# Patient Record
Sex: Male | Born: 1990 | Race: Black or African American | Hispanic: No | Marital: Single | Smoking: Never smoker
Health system: Southern US, Community
[De-identification: ages and names within clinical notes are randomized; demographics above are authoritative.]

## PROBLEM LIST (undated history)

## (undated) HISTORY — PX: NOSE SURGERY: SHX723

---
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
## 2011-05-08 ENCOUNTER — Emergency Department: Payer: Self-pay | Admitting: Unknown Physician Specialty

## 2014-05-23 ENCOUNTER — Emergency Department: Payer: Self-pay | Admitting: Emergency Medicine

## 2014-05-23 LAB — URINALYSIS, COMPLETE
BACTERIA: NONE SEEN
BILIRUBIN, UR: NEGATIVE
Blood: NEGATIVE
GLUCOSE, UR: NEGATIVE mg/dL (ref 0–75)
Leukocyte Esterase: NEGATIVE
Nitrite: NEGATIVE
Ph: 7 (ref 4.5–8.0)
Protein: 30
Specific Gravity: 1.026 (ref 1.003–1.030)

## 2014-05-23 LAB — COMPREHENSIVE METABOLIC PANEL
ALK PHOS: 59 U/L
ALT: 22 U/L
Albumin: 4.3 g/dL (ref 3.4–5.0)
Anion Gap: 11 (ref 7–16)
BUN: 10 mg/dL (ref 7–18)
Bilirubin,Total: 0.6 mg/dL (ref 0.2–1.0)
Calcium, Total: 9.1 mg/dL (ref 8.5–10.1)
Chloride: 106 mmol/L (ref 98–107)
Co2: 23 mmol/L (ref 21–32)
Creatinine: 1.28 mg/dL (ref 0.60–1.30)
EGFR (African American): >60
GLUCOSE: 119 mg/dL — AB (ref 65–99)
Osmolality: 280 (ref 275–301)
Potassium: 3.7 mmol/L (ref 3.5–5.1)
SGOT(AST): 17 U/L (ref 15–37)
Sodium: 140 mmol/L (ref 136–145)
Total Protein: 8 g/dL (ref 6.4–8.2)

## 2014-05-23 LAB — CBC
HCT: 46.7 % (ref 40.0–52.0)
HGB: 15.2 g/dL (ref 13.0–18.0)
MCH: 30.1 pg (ref 26.0–34.0)
MCHC: 32.5 g/dL (ref 32.0–36.0)
MCV: 93 fL (ref 80–100)
Platelet: 314 10*3/uL (ref 150–440)
RBC: 5.04 10*6/uL (ref 4.40–5.90)
RDW: 13.6 % (ref 11.5–14.5)
WBC: 6.7 10*3/uL (ref 3.8–10.6)

## 2018-12-30 ENCOUNTER — Other Ambulatory Visit: Payer: Self-pay

## 2018-12-30 DIAGNOSIS — Z20822 Contact with and (suspected) exposure to covid-19: Secondary | ICD-10-CM

## 2018-12-31 LAB — NOVEL CORONAVIRUS, NAA: SARS-CoV-2, NAA: DETECTED — AB

## 2019-01-08 ENCOUNTER — Other Ambulatory Visit: Payer: Self-pay

## 2019-01-08 DIAGNOSIS — Z20822 Contact with and (suspected) exposure to covid-19: Secondary | ICD-10-CM

## 2019-01-09 LAB — NOVEL CORONAVIRUS, NAA: SARS-CoV-2, NAA: NOT DETECTED

## 2021-08-03 ENCOUNTER — Ambulatory Visit (INDEPENDENT_AMBULATORY_CARE_PROVIDER_SITE_OTHER): Payer: 59 | Admitting: Nurse Practitioner

## 2021-08-03 ENCOUNTER — Other Ambulatory Visit (HOSPITAL_COMMUNITY)
Admission: RE | Admit: 2021-08-03 | Discharge: 2021-08-03 | Disposition: A | Discharge status: Home / Self Care | Payer: Self-pay | Source: Ambulatory Visit | Attending: Nurse Practitioner | Admitting: Nurse Practitioner

## 2021-08-03 ENCOUNTER — Encounter: Payer: Self-pay | Admitting: Nurse Practitioner

## 2021-08-03 ENCOUNTER — Other Ambulatory Visit: Payer: Self-pay

## 2021-08-03 VITALS — BP 118/72 | HR 81 | Temp 97.9°F | Resp 18 | Ht 67.0 in | Wt 172.4 lb

## 2021-08-03 DIAGNOSIS — Z130 Encounter for screening for diseases of the blood and blood-forming organs and certain disorders involving the immune mechanism: Secondary | ICD-10-CM | POA: Diagnosis not present

## 2021-08-03 DIAGNOSIS — Z114 Encounter for screening for human immunodeficiency virus [HIV]: Secondary | ICD-10-CM

## 2021-08-03 DIAGNOSIS — Z23 Encounter for immunization: Secondary | ICD-10-CM | POA: Diagnosis not present

## 2021-08-03 DIAGNOSIS — Z13220 Encounter for screening for lipoid disorders: Secondary | ICD-10-CM | POA: Diagnosis not present

## 2021-08-03 DIAGNOSIS — Z113 Encounter for screening for infections with a predominantly sexual mode of transmission: Secondary | ICD-10-CM | POA: Diagnosis not present

## 2021-08-03 DIAGNOSIS — Z1159 Encounter for screening for other viral diseases: Secondary | ICD-10-CM

## 2021-08-03 DIAGNOSIS — Z131 Encounter for screening for diabetes mellitus: Secondary | ICD-10-CM

## 2021-08-03 DIAGNOSIS — Z7689 Persons encountering health services in other specified circumstances: Secondary | ICD-10-CM

## 2021-08-03 NOTE — Progress Notes (Signed)
? ?  BP 118/72   Pulse 81   Temp 97.9 ?F (36.6 ?C) (Oral)   Resp 18   Ht 5\' 7"  (1.702 m)   Wt 172 lb 6.4 oz (78.2 kg)   SpO2 98%   BMI 27.00 kg/m?   ? ?Subjective:  ? ? Patient ID: Bryan Richardson, male    DOB: 1991/01/06, 31 y.o.   MRN: [id number] ? ?HPI: ?Bryan Richardson is a 31 y.o. male, here alone ? ?Chief Complaint  ?Patient presents with  ? Establish Care  ? Exposure to STD  ? ?Establish care: last physical was a few years ago.  He does not have any medical history. ? ?Exposure to STD: He says that his girlfriend tested positive for herpes. He would like to be tested. He says that he does not have any sores or blisters on his penis. He denies any penile discharge or pain.  Will get labs.  Discussed treatment options if positive.   ? ?Relevant past medical, surgical, family and social history reviewed and updated as indicated. Interim medical history since our last visit reviewed. ?Allergies and medications reviewed and updated. ? ?Review of Systems ? ?Constitutional: Negative for fever or weight change.  ?Respiratory: Negative for cough and shortness of breath.   ?Cardiovascular: Negative for chest pain or palpitations.  ?Gastrointestinal: Negative for abdominal pain, no bowel changes.  ?Musculoskeletal: Negative for gait problem or joint swelling.  ?Skin: Negative for rash.  ?Neurological: Negative for dizziness or headache.  ?No other specific complaints in a complete review of systems (except as listed in HPI above).  ? ?   ?Objective:  ?  ?BP 118/72   Pulse 81   Temp 97.9 ?F (36.6 ?C) (Oral)   Resp 18   Ht 5\' 7"  (1.702 m)   Wt 172 lb 6.4 oz (78.2 kg)   SpO2 98%   BMI 27.00 kg/m?   ?Wt Readings from Last 3 Encounters:  ?08/03/21 172 lb 6.4 oz (78.2 kg)  ?  ?Physical Exam ? ?Constitutional: Patient appears well-developed and well-nourished.  No distress.  ?HEENT: head atraumatic, normocephalic, pupils equal and reactive to light, neck supple ?Cardiovascular: Normal rate, regular rhythm and normal  heart sounds.  No murmur heard. No BLE edema. ?Pulmonary/Chest: Effort normal and breath sounds normal. No respiratory distress. ?Abdominal: Soft.  There is no tenderness. ?Psychiatric: Patient has a normal mood and affect. behavior is normal. Judgment and thought content normal.  ? ?Results for orders placed or performed in visit on 01/08/19  ?Novel Coronavirus, NAA ([facility])  ? Specimen: Oropharyngeal(OP) collection in vial transport medium  ? OROPHARYNGEA  TESTING  ?Result Value Ref Range  ? SARS-CoV-2, NAA Not Detected Not Detected  ? ?   ?Assessment & Plan:  ? ?1. Screening for STD (sexually transmitted disease) ? ?- RPR ?- HIV Antibody (routine testing w rflx) ?- HSV(herpes simplex vrs) 1+2 ab-IgG ?- Urine cytology ancillary only ? ?2. Screening for diabetes mellitus ? ?- COMPLETE METABOLIC PANEL WITH GFR ? ?3. Screening for cholesterol level ? ?- Lipid panel ? ?4. Screening for deficiency anemia ? ?- CBC with Differential/Platelet ? ?5. Screening for HIV (human immunodeficiency virus) ? ?- HIV Antibody (routine testing w rflx) ? ?6. Encounter for hepatitis C screening test for low risk patient ? ?- Hepatitis C antibody ? ?7. Encounter to establish care ?-schedule for cpe ? ?Follow up plan: ?Return in about 3 months (around 11/03/2021) for cpe. ? ? ? ? ? ?

## 2021-08-03 NOTE — Addendum Note (Signed)
Addended by: Andersen Mckiver, Sherrill Raring on: 08/03/2021 12:01 PM ? ? Modules accepted: Orders ? ?

## 2021-08-04 LAB — COMPLETE METABOLIC PANEL WITH GFR
AG Ratio: 1.6 (calc) (ref 1.0–2.5)
ALT: 16 U/L (ref 9–46)
AST: 16 U/L (ref 10–40)
Albumin: 4.4 g/dL (ref 3.6–5.1)
Alkaline phosphatase (APISO): 59 U/L (ref 36–130)
BUN: 11 mg/dL (ref 7–25)
CO2: 28 mmol/L (ref 20–32)
Calcium: 9.8 mg/dL (ref 8.6–10.3)
Chloride: 105 mmol/L (ref 98–110)
Creat: 1.05 mg/dL (ref 0.60–1.26)
Globulin: 2.7 g/dL (calc) (ref 1.9–3.7)
Glucose, Bld: 91 mg/dL (ref 65–99)
Potassium: 4.4 mmol/L (ref 3.5–5.3)
Sodium: 141 mmol/L (ref 135–146)
Total Bilirubin: 0.4 mg/dL (ref 0.2–1.2)
Total Protein: 7.1 g/dL (ref 6.1–8.1)
eGFR: 98 mL/min/{1.73_m2} (ref >=60)

## 2021-08-04 LAB — HEPATITIS C ANTIBODY
Hepatitis C Ab: NONREACTIVE
SIGNAL TO CUT-OFF: 0.05 (ref <1.00)

## 2021-08-04 LAB — CBC WITH DIFFERENTIAL/PLATELET
Absolute Monocytes: 979 cells/uL — ABNORMAL HIGH (ref 200–950)
Basophils Absolute: 41 cells/uL (ref 0–200)
Basophils Relative: 0.7 %
Eosinophils Absolute: 189 cells/uL (ref 15–500)
Eosinophils Relative: 3.2 %
HCT: 43.5 % (ref 38.5–50.0)
Hemoglobin: 14.1 g/dL (ref 13.2–17.1)
Lymphs Abs: 2195 cells/uL (ref 850–3900)
MCH: 29.7 pg (ref 27.0–33.0)
MCHC: 32.4 g/dL (ref 32.0–36.0)
MCV: 91.8 fL (ref 80.0–100.0)
MPV: 11.5 fL (ref 7.5–12.5)
Monocytes Relative: 16.6 %
Neutro Abs: 2496 cells/uL (ref 1500–7800)
Neutrophils Relative %: 42.3 %
Platelets: 370 10*3/uL (ref 140–400)
RBC: 4.74 10*6/uL (ref 4.20–5.80)
RDW: 11.8 % (ref 11.0–15.0)
Total Lymphocyte: 37.2 %
WBC: 5.9 10*3/uL (ref 3.8–10.8)

## 2021-08-04 LAB — RPR: RPR Ser Ql: NONREACTIVE

## 2021-08-04 LAB — LIPID PANEL
Cholesterol: 140 mg/dL (ref <200)
HDL: 62 mg/dL (ref >=40)
LDL Cholesterol (Calc): 64 mg/dL (calc)
Non-HDL Cholesterol (Calc): 78 mg/dL (calc) (ref <130)
Total CHOL/HDL Ratio: 2.3 (calc) (ref <5.0)
Triglycerides: 61 mg/dL (ref <150)

## 2021-08-04 LAB — HIV ANTIBODY (ROUTINE TESTING W REFLEX): HIV 1&2 Ab, 4th Generation: NONREACTIVE

## 2021-08-04 LAB — URINE CYTOLOGY ANCILLARY ONLY
Chlamydia: NEGATIVE
Comment: NEGATIVE
Comment: NORMAL
Neisseria Gonorrhea: NEGATIVE

## 2021-08-04 LAB — HSV(HERPES SIMPLEX VRS) I + II AB-IGG
HAV 1 IGG,TYPE SPECIFIC AB: <0.9 index
HSV 2 IGG,TYPE SPECIFIC AB: <0.9 index

## 2021-10-17 ENCOUNTER — Ambulatory Visit
Admission: RE | Admit: 2021-10-17 | Discharge: 2021-10-17 | Disposition: A | Discharge status: Home / Self Care | Payer: 59 | Attending: Family Medicine | Admitting: Family Medicine

## 2021-10-17 ENCOUNTER — Other Ambulatory Visit: Payer: Self-pay

## 2021-10-17 ENCOUNTER — Encounter: Payer: Self-pay | Admitting: Family Medicine

## 2021-10-17 ENCOUNTER — Ambulatory Visit (INDEPENDENT_AMBULATORY_CARE_PROVIDER_SITE_OTHER): Payer: 59 | Admitting: Family Medicine

## 2021-10-17 ENCOUNTER — Ambulatory Visit
Admission: RE | Admit: 2021-10-17 | Discharge: 2021-10-17 | Disposition: A | Discharge status: Home / Self Care | Payer: 59 | Source: Ambulatory Visit | Attending: Family Medicine | Admitting: Family Medicine

## 2021-10-17 VITALS — BP 110/76 | HR 80 | Temp 97.5°F | Resp 18 | Ht 67.0 in | Wt 172.6 lb

## 2021-10-17 DIAGNOSIS — M79641 Pain in right hand: Secondary | ICD-10-CM

## 2021-10-17 DIAGNOSIS — M79643 Pain in unspecified hand: Secondary | ICD-10-CM | POA: Insufficient documentation
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
## 2021-10-17 NOTE — Patient Instructions (Signed)
It was great to see you!  Our plans for today:  - We are getting an xray of your hand. We will call you with these results. - Take ibuprofen 600mg  every 6-8 hours as needed for pain.   Take care and seek immediate care sooner if you develop any concerns.   Dr. 
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
## 2021-10-18 NOTE — Progress Notes (Signed)
Patient notified

## 2021-10-18 NOTE — Addendum Note (Signed)
Addended by: Myles Gip on: 10/18/2021 10:33 AM   Modules accepted: Orders

## 2021-10-31 ENCOUNTER — Ambulatory Visit: Payer: Self-pay | Admitting: Nurse Practitioner

## 2022-05-16 ENCOUNTER — Ambulatory Visit (INDEPENDENT_AMBULATORY_CARE_PROVIDER_SITE_OTHER): Payer: Commercial Managed Care - HMO | Admitting: Nurse Practitioner

## 2022-05-16 ENCOUNTER — Ambulatory Visit: Payer: Self-pay | Admitting: Nurse Practitioner

## 2022-05-16 ENCOUNTER — Encounter: Payer: Self-pay | Admitting: Nurse Practitioner

## 2022-05-16 VITALS — BP 122/84 | HR 92 | Temp 98.2°F | Resp 18 | Ht 67.0 in

## 2022-05-16 DIAGNOSIS — J0140 Acute pansinusitis, unspecified: Secondary | ICD-10-CM | POA: Diagnosis not present

## 2022-05-16 MED ORDER — AMOXICILLIN-POT CLAVULANATE 875-125 MG PO TABS: 1.0000 | ORAL_TABLET | Freq: Two times a day (BID) | ORAL | 0 refills | Status: DC

## 2022-05-16 NOTE — Progress Notes (Signed)
BP 122/84   Pulse 92   Temp 98.2 F (36.8 C)   Resp 18   Ht 5\' 7"  (1.702 m)   SpO2 98%   BMI 27.03 kg/m    Subjective:    Patient ID: Bryan Richardson, male    DOB: Feb 23, 1991, 32 y.o.   MRN: [id number]  HPI: Bryan Richardson is a 32 y.o. male  Chief Complaint  Patient presents with   Sinusitis    Onset 3 weeks, congestion cough w/ phlem green, headache and facial pressure   Sinus infection:  patient reports symptoms started three weeks ago. He says that now all the pressure is in the left side of his face behind his left eye.  He says he has nasal congestion and facial pressure. He denies any fever or shortness of breath. He says he has a mild cough but not bad.  He is taking mucinex for symptoms and over the counter sinus cold medication.  Will treat with antibiotics.   Can also take flonase, and zyrtec to help with symptoms.  Push fluids and get rest.    Relevant past medical, surgical, family and social history reviewed and updated as indicated. Interim medical history since our last visit reviewed. Allergies and medications reviewed and updated.  Review of Systems  Constitutional: Negative for fever or weight change.  HEENT: positive for nasal congestion and sinus pressure Respiratory: positive  for cough and  negative for shortness of breath.   Cardiovascular: Negative for chest pain or palpitations.  Gastrointestinal: Negative for abdominal pain, no bowel changes.  Musculoskeletal: Negative for gait problem or joint swelling.  Skin: Negative for rash.  Neurological: Negative for dizziness or headache.  No other specific complaints in a complete review of systems (except as listed in HPI above).      Objective:    BP 122/84   Pulse 92   Temp 98.2 F (36.8 C)   Resp 18   Ht 5\' 7"  (1.702 m)   SpO2 98%   BMI 27.03 kg/m   Wt Readings from Last 3 Encounters:  10/17/21 172 lb 9.6 oz (78.3 kg)  08/03/21 172 lb 6.4 oz (78.2 kg)    Physical Exam  Constitutional:  Patient appears well-developed and well-nourished. No distress.  HEENT: head atraumatic, normocephalic, pupils equal and reactive to light, ears TMs clear, neck supple, throat within normal limits, facial tenderness Cardiovascular: Normal rate, regular rhythm and normal heart sounds.  No murmur heard. No BLE edema. Pulmonary/Chest: Effort normal and breath sounds normal. No respiratory distress. Abdominal: Soft.  There is no tenderness. Psychiatric: Patient has a normal mood and affect. behavior is normal. Judgment and thought content normal.  Results for orders placed or performed in visit on 08/03/21  RPR  Result Value Ref Range   RPR Ser Ql NON-REACTIVE NON-REACTIVE  HIV Antibody (routine testing w rflx)  Result Value Ref Range   HIV 1&2 Ab, 4th Generation NON-REACTIVE NON-REACTIVE  Hepatitis C antibody  Result Value Ref Range   Hepatitis C Ab NON-REACTIVE NON-REACTIVE   SIGNAL TO CUT-OFF 0.05 <1.00  HSV(herpes simplex vrs) 1+2 ab-IgG  Result Value Ref Range   HAV 1 IGG,TYPE SPECIFIC AB <0.90 index   HSV 2 IGG,TYPE SPECIFIC AB <0.90 index  Lipid panel  Result Value Ref Range   Cholesterol 140 <200 mg/dL   HDL 62 > OR = 40 mg/dL   Triglycerides 61 08/05/21 mg/dL   LDL Cholesterol (Calc) 64 mg/dL (calc)   Total CHOL/HDL Ratio  2.3 <5.0 (calc)   Non-HDL Cholesterol (Calc) 78 <130 mg/dL (calc)  CBC with Differential/Platelet  Result Value Ref Range   WBC 5.9 3.8 - 10.8 Thousand/uL   RBC 4.74 4.20 - 5.80 Million/uL   Hemoglobin 14.1 13.2 - 17.1 g/dL   HCT 43.5 38.5 - 50.0 %   MCV 91.8 80.0 - 100.0 fL   MCH 29.7 27.0 - 33.0 pg   MCHC 32.4 32.0 - 36.0 g/dL   RDW 11.8 11.0 - 15.0 %   Platelets 370 140 - 400 Thousand/uL   MPV 11.5 7.5 - 12.5 fL   Neutro Abs 2,496 1,500 - 7,800 cells/uL   Lymphs Abs 2,195 850 - 3,900 cells/uL   Absolute Monocytes 979 (H) 200 - 950 cells/uL   Eosinophils Absolute 189 15 - 500 cells/uL   Basophils Absolute 41 0 - 200 cells/uL   Neutrophils Relative %  42.3 %   Total Lymphocyte 37.2 %   Monocytes Relative 16.6 %   Eosinophils Relative 3.2 %   Basophils Relative 0.7 %  COMPLETE METABOLIC PANEL WITH GFR  Result Value Ref Range   Glucose, Bld 91 65 - 99 mg/dL   BUN 11 7 - 25 mg/dL   Creat 1.05 0.60 - 1.26 mg/dL   eGFR 98 > OR = 60 mL/min/1.37m2   BUN/Creatinine Ratio NOT APPLICABLE 6 - 22 (calc)   Sodium 141 135 - 146 mmol/L   Potassium 4.4 3.5 - 5.3 mmol/L   Chloride 105 98 - 110 mmol/L   CO2 28 20 - 32 mmol/L   Calcium 9.8 8.6 - 10.3 mg/dL   Total Protein 7.1 6.1 - 8.1 g/dL   Albumin 4.4 3.6 - 5.1 g/dL   Globulin 2.7 1.9 - 3.7 g/dL (calc)   AG Ratio 1.6 1.0 - 2.5 (calc)   Total Bilirubin 0.4 0.2 - 1.2 mg/dL   Alkaline phosphatase (APISO) 59 36 - 130 U/L   AST 16 10 - 40 U/L   ALT 16 9 - 46 U/L  Urine cytology ancillary only  Result Value Ref Range   Neisseria Gonorrhea Negative    Chlamydia Negative    Comment Normal Reference Ranger Chlamydia - Negative    Comment      Normal Reference Range Neisseria Gonorrhea - Negative      Assessment & Plan:   Problem List Items Addressed This Visit   None Visit Diagnoses     Acute non-recurrent pansinusitis    -  Primary   start augmentin.  can take flonase and zyrtec for symptoms, continue mucinex push fluids.   Relevant Medications   amoxicillin-clavulanate (AUGMENTIN) 875-125 MG tablet        Follow up plan: Return if symptoms worsen or fail to improve.

## 2022-05-17 ENCOUNTER — Ambulatory Visit: Payer: Self-pay | Admitting: Nurse Practitioner

## 2022-05-18 ENCOUNTER — Ambulatory Visit: Payer: Commercial Managed Care - HMO | Admitting: Nurse Practitioner

## 2023-02-27 ENCOUNTER — Ambulatory Visit: Payer: Commercial Managed Care - HMO | Admitting: Family Medicine

## 2023-02-27 ENCOUNTER — Encounter: Payer: Self-pay | Admitting: Family Medicine

## 2023-02-27 VITALS — BP 134/86 | HR 98 | Temp 99.0°F | Resp 16 | Ht 67.0 in | Wt 172.7 lb

## 2023-02-27 DIAGNOSIS — R519 Headache, unspecified: Secondary | ICD-10-CM

## 2023-02-27 DIAGNOSIS — R509 Fever, unspecified: Secondary | ICD-10-CM

## 2023-02-27 DIAGNOSIS — R52 Pain, unspecified: Secondary | ICD-10-CM

## 2023-02-27 LAB — POCT INFLUENZA A/B
Influenza A, POC: NEGATIVE — NL
Influenza B, POC: NEGATIVE — NL

## 2023-02-27 NOTE — Progress Notes (Unsigned)
Patient ID: Bryan Richardson, male    DOB: March 07, 1991, 32 y.o.   MRN: [id number]  PCP: Berniece Salines, FNP  Chief Complaint  Patient presents with   Follow-up    Subjective:   Bryan Richardson is a 32 y.o. male, presents to clinic with CC of the following:  Pt presents with URI sx, congestion, body aches, fevers chills and sweats onset 2 d ago His children have been sick with multiple viral illnesses and also flu.  URI  This is a new problem. Episode onset: 2 d ago. The problem has been unchanged. Maximum temperature: tmax 102. The fever has been present for 1 to 2 days. Associated symptoms include congestion, coughing, headaches, rhinorrhea, sinus pain and a sore throat. Pertinent negatives include no abdominal pain, chest pain, diarrhea, dysuria, ear pain, nausea, neck pain, plugged ear sensation, sneezing, vomiting or wheezing. Associated symptoms comments: Hot cold chills, body aches. The treatment provided no relief.      Patient Active Problem List   Diagnosis Date Noted   Hand pain 10/17/2021      Current Outpatient Medications:    amoxicillin-clavulanate (AUGMENTIN) 875-125 MG tablet, Take 1 tablet by mouth 2 (two) times daily., Disp: 20 tablet, Rfl: 0   No Known Allergies   Social History   Tobacco Use   Smoking status: Never    Passive exposure: Past   Smokeless tobacco: Never  Vaping Use   Vaping status: Never Used  Substance Use Topics   Alcohol use: Yes   Drug use: Yes    Types: Marijuana      Chart Review Today: I personally reviewed active problem list, medication list, allergies, family history, social history, health maintenance, notes from last encounter, lab results, imaging with the patient/caregiver today.   Review of Systems  Constitutional: Negative.   HENT:  Positive for congestion, rhinorrhea, sinus pain and sore throat. Negative for ear pain and sneezing.   Eyes: Negative.   Respiratory:  Positive for cough. Negative for wheezing.    Cardiovascular: Negative.  Negative for chest pain.  Gastrointestinal: Negative.  Negative for abdominal pain, diarrhea, nausea and vomiting.  Endocrine: Negative.   Genitourinary: Negative.  Negative for dysuria.  Musculoskeletal: Negative.  Negative for neck pain.  Skin: Negative.   Allergic/Immunologic: Negative.   Neurological:  Positive for headaches.  Hematological: Negative.   Psychiatric/Behavioral: Negative.    All other systems reviewed and are negative.      Objective:   Vitals:   02/27/23 1312  BP: 134/86  Pulse: 98  Resp: 16  Temp: 99 F (37.2 C)  TempSrc: Oral  SpO2: 100%  Weight: 172 lb 11.2 oz (78.3 kg)  Height: 5\' 7"  (1.702 m)    Body mass index is 27.05 kg/m.  Physical Exam Vitals and nursing note reviewed.  Constitutional:      General: He is not in acute distress.    Appearance: Normal appearance. He is well-developed. He is not ill-appearing, toxic-appearing or diaphoretic.  HENT:     Head: Normocephalic and atraumatic.     Jaw: No trismus.     Right Ear: Tympanic membrane, ear canal and external ear normal.     Left Ear: Tympanic membrane, ear canal and external ear normal.     Nose: Mucosal edema, congestion and rhinorrhea present.     Right Turbinates: Enlarged and swollen.     Left Turbinates: Enlarged and swollen.     Right Sinus: No maxillary sinus tenderness or frontal  sinus tenderness.     Left Sinus: No maxillary sinus tenderness or frontal sinus tenderness.     Mouth/Throat:     Mouth: Mucous membranes are normal. Mucous membranes are moist. Mucous membranes are not pale, not dry and not cyanotic.     Pharynx: Uvula midline. Posterior oropharyngeal erythema present. No oropharyngeal exudate, posterior oropharyngeal edema or uvula swelling.     Tonsils: No tonsillar exudate or tonsillar abscesses.  Eyes:     General: Lids are normal. No scleral icterus.       Right eye: No discharge.        Left eye: No discharge.     Extraocular  Movements: EOM normal.     Conjunctiva/sclera: Conjunctivae normal.  Neck:     Trachea: Trachea and phonation normal. No tracheal deviation.  Cardiovascular:     Rate and Rhythm: Normal rate and regular rhythm.     Pulses:          Radial pulses are 2+ on the right side and 2+ on the left side.     Heart sounds: Normal heart sounds. No murmur heard.    No friction rub. No gallop.  Pulmonary:     Effort: Pulmonary effort is normal. No tachypnea, accessory muscle usage or respiratory distress.     Breath sounds: Normal breath sounds. No stridor. No decreased breath sounds, wheezing, rhonchi or rales.  Chest:     Chest wall: No tenderness.  Abdominal:     General: Bowel sounds are normal.  Musculoskeletal:        General: No edema.     Cervical back: Normal range of motion and neck supple.  Lymphadenopathy:     Cervical: No cervical adenopathy.  Skin:    General: Skin is warm, dry and intact.     Capillary Refill: Capillary refill takes less than 2 seconds.     Coloration: Skin is not pale.     Findings: No rash.     Nails: There is no clubbing.  Neurological:     Mental Status: He is alert.     Motor: No abnormal muscle tone.     Coordination: Coordination normal.     Gait: Gait normal.  Psychiatric:        Mood and Affect: Mood and affect normal.        Speech: Speech normal.        Behavior: Behavior normal. Behavior is cooperative.      Results for orders placed or performed in visit on 08/03/21  RPR  Result Value Ref Range   RPR Ser Ql NON-REACTIVE NON-REACTIVE  HIV Antibody (routine testing w rflx)  Result Value Ref Range   HIV 1&2 Ab, 4th Generation NON-REACTIVE NON-REACTIVE  Hepatitis C antibody  Result Value Ref Range   Hepatitis C Ab NON-REACTIVE NON-REACTIVE   SIGNAL TO CUT-OFF 0.05 <1.00  HSV(herpes simplex vrs) 1+2 ab-IgG  Result Value Ref Range   HAV 1 IGG,TYPE SPECIFIC AB <0.90 index   HSV 2 IGG,TYPE SPECIFIC AB <0.90 index  Lipid panel  Result  Value Ref Range   Cholesterol 140 <200 mg/dL   HDL 62 > OR = 40 mg/dL   Triglycerides 61 <284 mg/dL   LDL Cholesterol (Calc) 64 mg/dL (calc)   Total CHOL/HDL Ratio 2.3 <5.0 (calc)   Non-HDL Cholesterol (Calc) 78 <132 mg/dL (calc)  CBC with Differential/Platelet  Result Value Ref Range   WBC 5.9 3.8 - 10.8 Thousand/uL   RBC 4.74 4.20 - 5.80 Million/uL  Hemoglobin 14.1 13.2 - 17.1 g/dL   HCT 16.1 09.6 - 04.5 %   MCV 91.8 80.0 - 100.0 fL   MCH 29.7 27.0 - 33.0 pg   MCHC 32.4 32.0 - 36.0 g/dL   RDW 40.9 81.1 - 91.4 %   Platelets 370 140 - 400 Thousand/uL   MPV 11.5 7.5 - 12.5 fL   Neutro Abs 2,496 1,500 - 7,800 cells/uL   Lymphs Abs 2,195 850 - 3,900 cells/uL   Absolute Monocytes 979 (H) 200 - 950 cells/uL   Eosinophils Absolute 189 15 - 500 cells/uL   Basophils Absolute 41 0 - 200 cells/uL   Neutrophils Relative % 42.3 %   Total Lymphocyte 37.2 %   Monocytes Relative 16.6 %   Eosinophils Relative 3.2 %   Basophils Relative 0.7 %  COMPLETE METABOLIC PANEL WITH GFR  Result Value Ref Range   Glucose, Bld 91 65 - 99 mg/dL   BUN 11 7 - 25 mg/dL   Creat 7.82 9.56 - 2.13 mg/dL   eGFR 98 > OR = 60 YQ/MVH/8.46N6   BUN/Creatinine Ratio NOT APPLICABLE 6 - 22 (calc)   Sodium 141 135 - 146 mmol/L   Potassium 4.4 3.5 - 5.3 mmol/L   Chloride 105 98 - 110 mmol/L   CO2 28 20 - 32 mmol/L   Calcium 9.8 8.6 - 10.3 mg/dL   Total Protein 7.1 6.1 - 8.1 g/dL   Albumin 4.4 3.6 - 5.1 g/dL   Globulin 2.7 1.9 - 3.7 g/dL (calc)   AG Ratio 1.6 1.0 - 2.5 (calc)   Total Bilirubin 0.4 0.2 - 1.2 mg/dL   Alkaline phosphatase (APISO) 59 36 - 130 U/L   AST 16 10 - 40 U/L   ALT 16 9 - 46 U/L  Urine cytology ancillary only  Result Value Ref Range   Neisseria Gonorrhea Negative    Chlamydia Negative    Comment Normal Reference Ranger Chlamydia - Negative    Comment      Normal Reference Range Neisseria Gonorrhea - Negative       Assessment & Plan:   1. Acute nonintractable headache, unspecified  headache type HA with fever, chills, sweats - suspect a viral febrile illness - mutliple sick family members Flu neg Pending COVID test - he can also do home covid test and let us know results Rhinosinusitis evident on exam and most HA is sinus pressure - encouraged him to start steroid nasal sprays and can try sudafed decongestants Currently no indication for Abx - POCT Influenza A/B - Novel Coronavirus, NAA ([facility])  2. Generalized body aches  - POCT Influenza A/B - Novel Coronavirus, NAA ([facility])  3. Fever, unspecified fever cause  - POCT Influenza A/B - Novel Coronavirus, NAA ([facility])   encouraged supportive and sx measures - ie: rest, push fluids, tylenol/nsaids for fever/aches etc Work note provided and pt encouraged to stay out of work until 24+ h fever free w/o meds    Danelle Berry, PA-C 02/27/23 1:32 PM

## 2023-02-28 ENCOUNTER — Encounter: Payer: Self-pay | Admitting: Family Medicine

## 2023-03-01 LAB — NOVEL CORONAVIRUS, NAA: SARS-CoV-2, NAA: NOT DETECTED

## 2023-03-01 LAB — SPECIMEN STATUS REPORT

## 2023-03-21 IMAGING — CR DG HAND COMPLETE 3+V*R*
1 series · 3 of 3 positions shown · non-contrast
Comparison: None Available.

CLINICAL DATA: Hand pain punched a wall

EXAM:
RIGHT HAND - COMPLETE 3+ VIEW

[Series 1: dg hand complete right · 0.14mm/px · 3 of 3 slices shown]
[im 1/3]
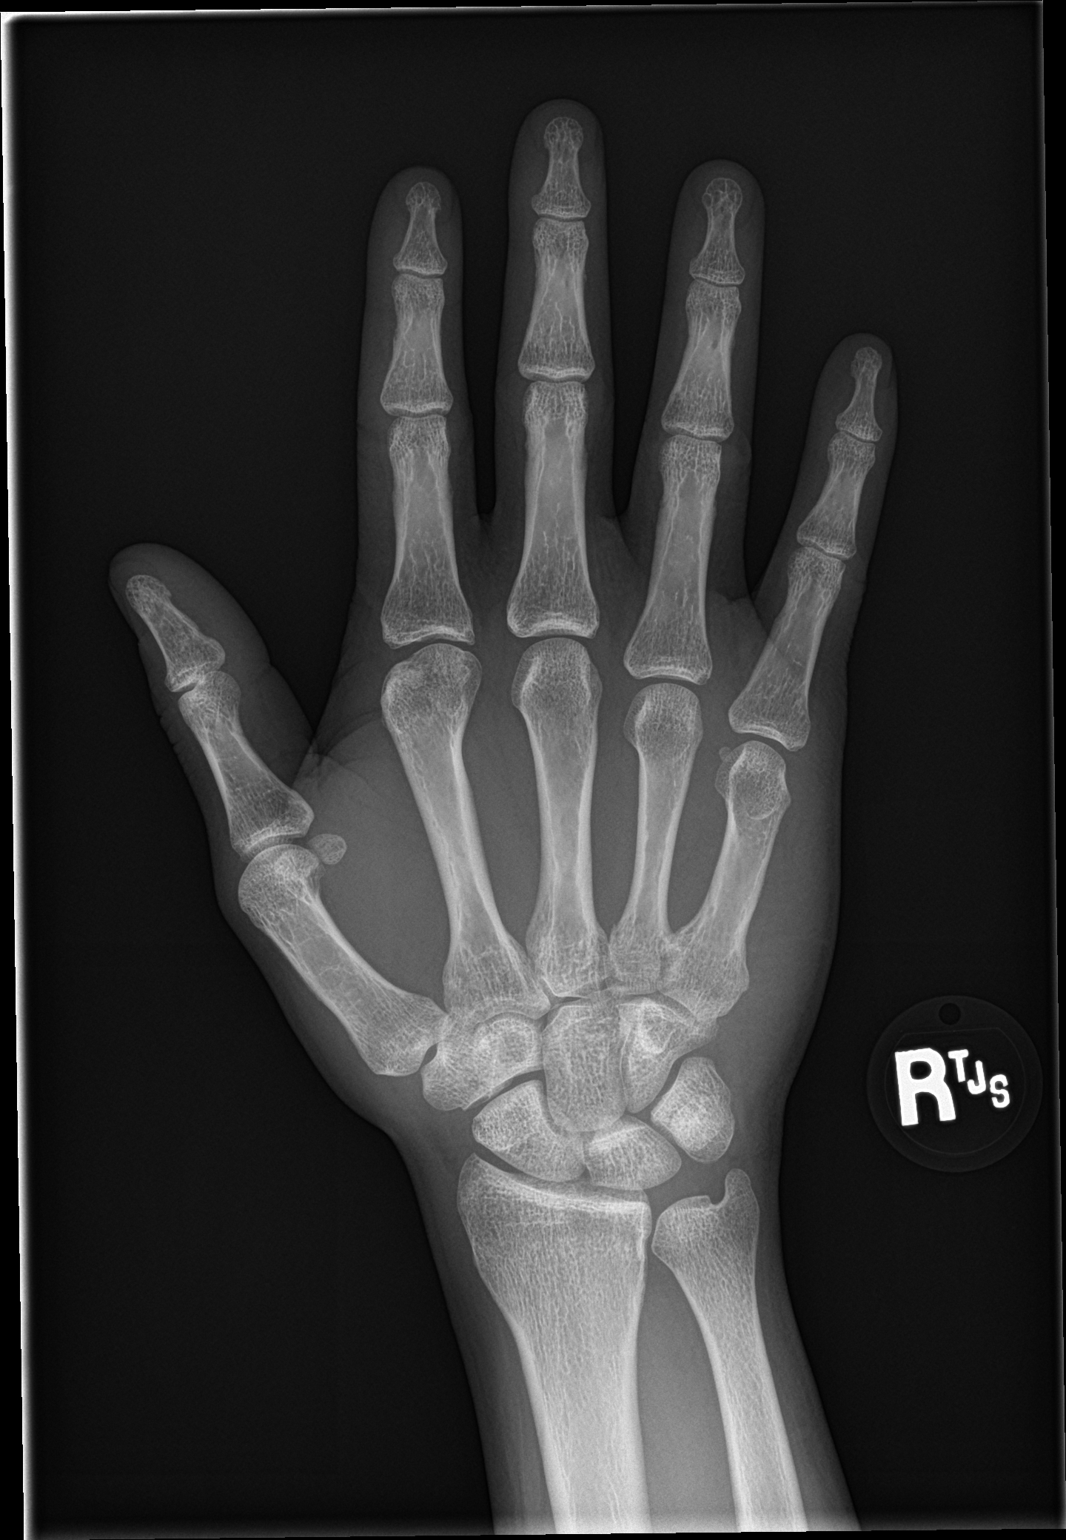
[im 2/3]
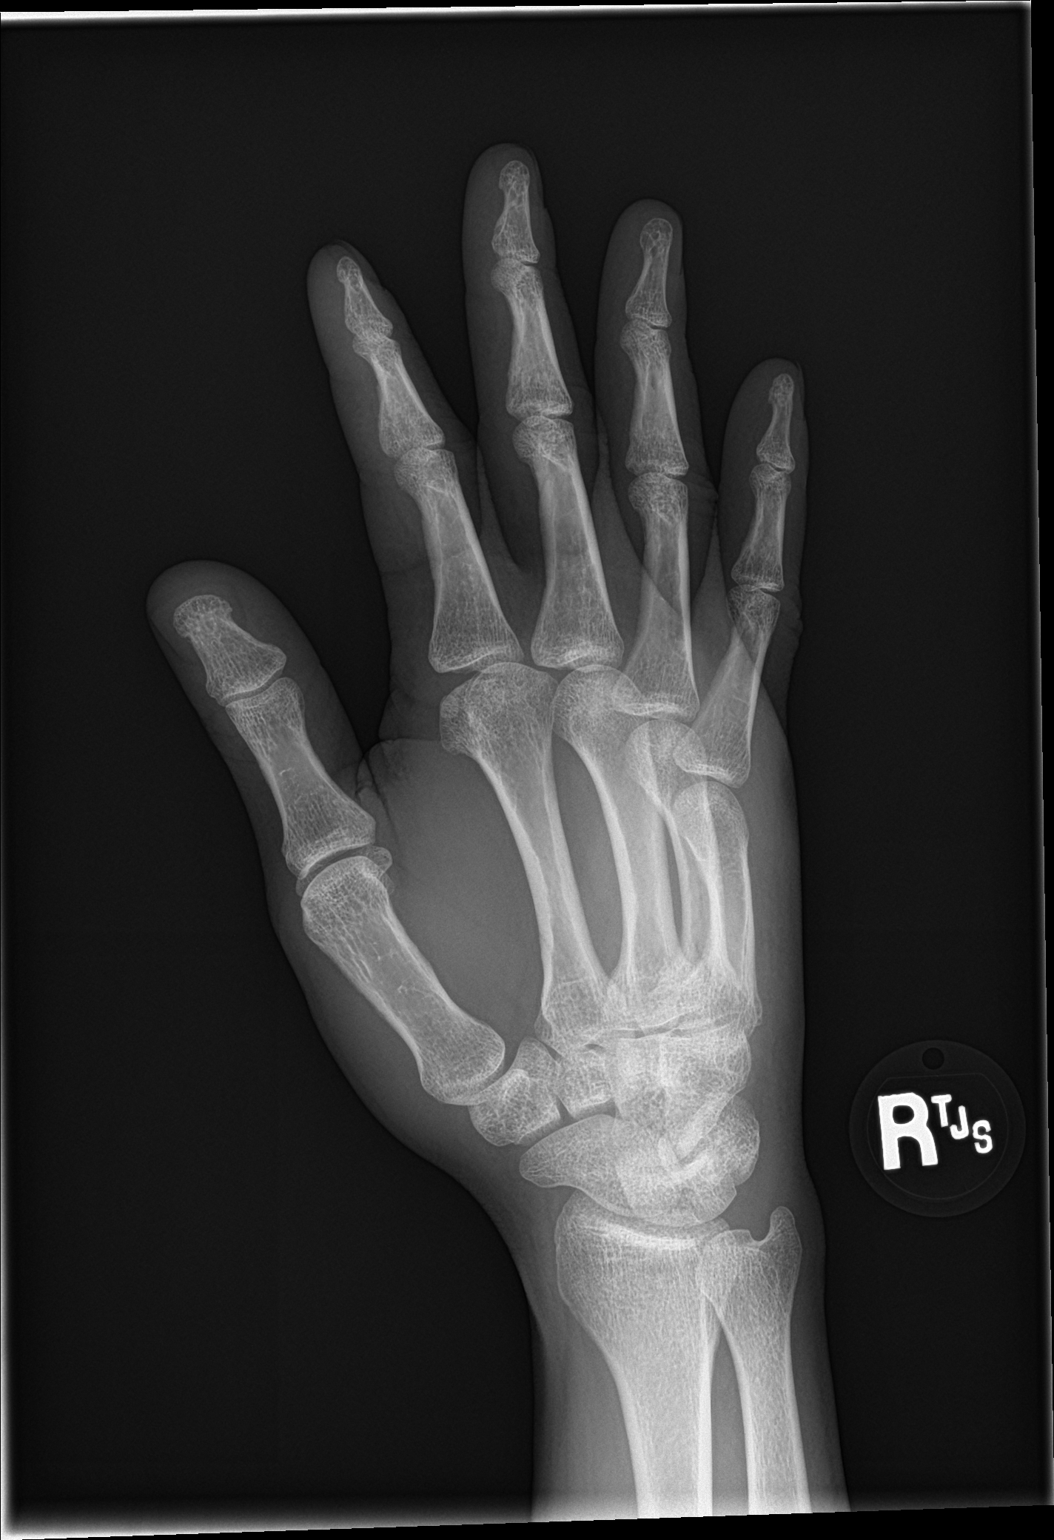
[im 3/3]
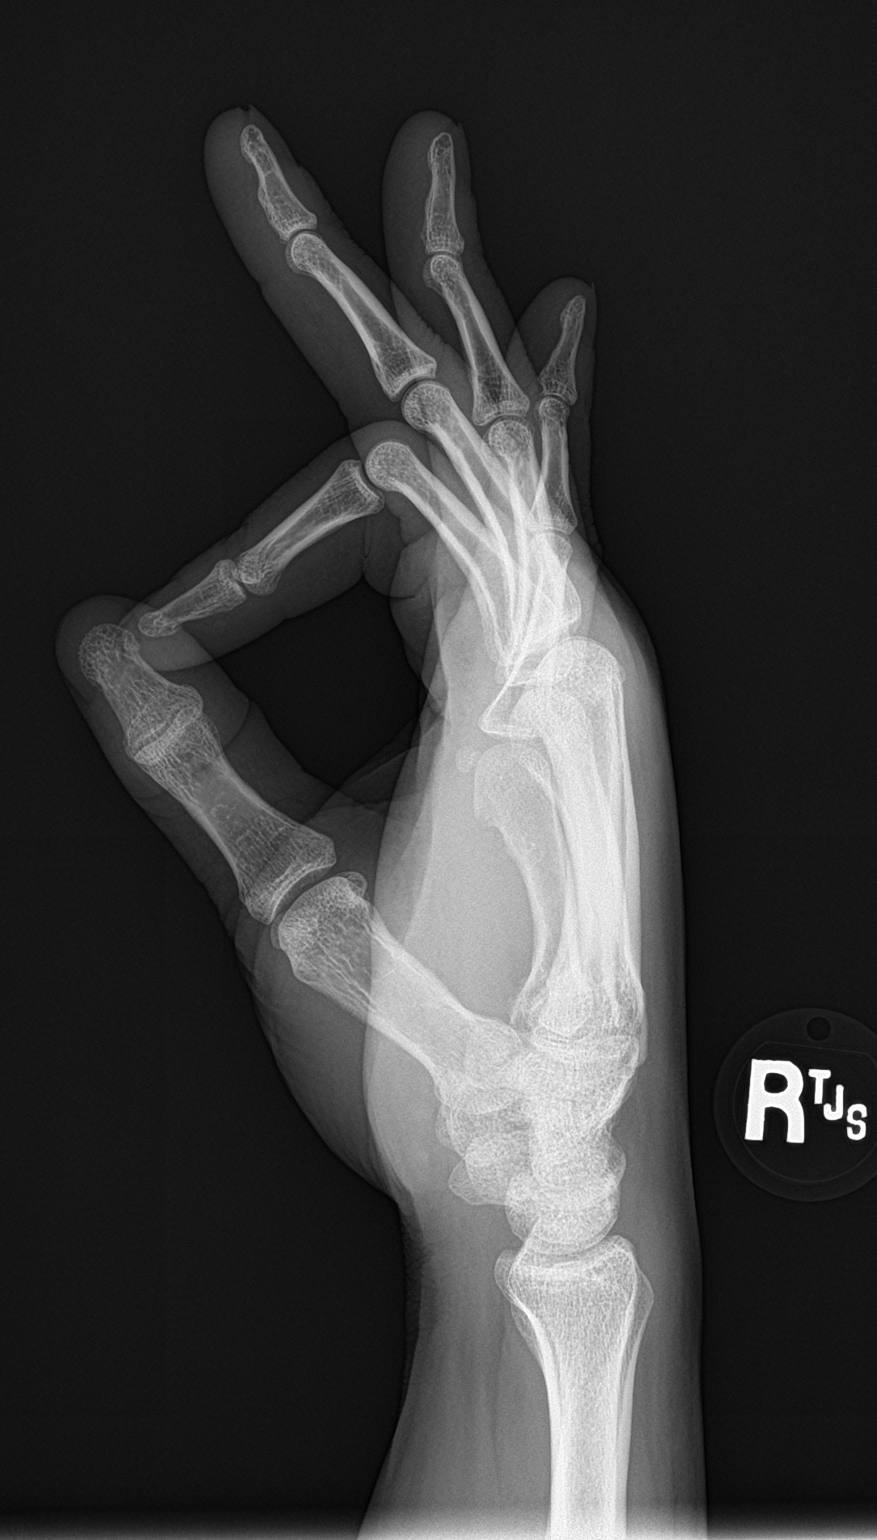

[3 of 3 positions shown; findings below may reference images not displayed]

FINDINGS: Probable nondisplaced fracture at the base of the fourth metacarpal.
No subluxation. Positive for soft tissue swelling
IMPRESSION: Probable nondisplaced fracture at the base of the fourth metacarpal.

## 2023-07-23 ENCOUNTER — Encounter: Payer: Self-pay | Admitting: Internal Medicine

## 2023-07-23 ENCOUNTER — Other Ambulatory Visit: Payer: Self-pay

## 2023-07-23 ENCOUNTER — Ambulatory Visit: Admitting: Internal Medicine

## 2023-07-23 VITALS — BP 132/90 | HR 75 | Temp 97.9°F | Resp 16 | Ht 71.0 in | Wt 169.4 lb

## 2023-07-23 DIAGNOSIS — S39012A Strain of muscle, fascia and tendon of lower back, initial encounter: Secondary | ICD-10-CM

## 2023-07-23 MED ORDER — NAPROXEN 500 MG PO TABS: 500.0000 mg | ORAL_TABLET | Freq: Two times a day (BID) | ORAL | 0 refills | Status: AC

## 2023-07-23 MED ORDER — TIZANIDINE HCL 4 MG PO TABS: 4.0000 mg | ORAL_TABLET | Freq: Every evening | ORAL | 0 refills | Status: DC | PRN
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
## 2023-08-19 ENCOUNTER — Other Ambulatory Visit: Payer: Self-pay | Admitting: Internal Medicine

## 2023-08-19 DIAGNOSIS — S39012A Strain of muscle, fascia and tendon of lower back, initial encounter: Secondary | ICD-10-CM

## 2023-08-21 NOTE — Telephone Encounter (Signed)
 Requested medications are due for refill today.  yes  Requested medications are on the active medications list.  yes  Last refill. 07/23/2023 #30 0 rf  Future visit scheduled.   no  Notes to clinic.  Refill not delegated.    Requested Prescriptions  Pending Prescriptions Disp Refills   tiZANidine (ZANAFLEX) 4 MG tablet [Pharmacy Med Name: TIZANIDINE HCL 4 MG TABLET] 30 tablet 0    Sig: TAKE 1 TABLET BY MOUTH AT BEDTIME AS NEEDED FOR MUSCLE SPASMS.     Not Delegated - Cardiovascular:  Alpha-2 Agonists - tizanidine Failed - 08/21/2023  7:41 AM      Failed - This refill cannot be delegated      Passed - Valid encounter within last 6 months    Recent Outpatient Visits           4 weeks ago Back strain, initial encounter   [facility] [place], [place]

## 2024-05-01 ENCOUNTER — Emergency Department
Admission: EM | Admit: 2024-05-01 | Discharge: 2024-05-01 | Disposition: A | Discharge status: Home / Self Care | Attending: Emergency Medicine | Admitting: Emergency Medicine

## 2024-05-01 ENCOUNTER — Other Ambulatory Visit: Payer: Self-pay

## 2024-05-01 ENCOUNTER — Emergency Department

## 2024-05-01 DIAGNOSIS — Y9301 Activity, walking, marching and hiking: Secondary | ICD-10-CM | POA: Insufficient documentation

## 2024-05-01 DIAGNOSIS — W19XXXA Unspecified fall, initial encounter: Secondary | ICD-10-CM | POA: Diagnosis not present

## 2024-05-01 DIAGNOSIS — M7989 Other specified soft tissue disorders: Secondary | ICD-10-CM | POA: Diagnosis not present

## 2024-05-01 DIAGNOSIS — S52501A Unspecified fracture of the lower end of right radius, initial encounter for closed fracture: Secondary | ICD-10-CM | POA: Insufficient documentation

## 2024-05-01 DIAGNOSIS — S6991XA Unspecified injury of right wrist, hand and finger(s), initial encounter: Secondary | ICD-10-CM | POA: Diagnosis present

## 2024-05-01 MED ORDER — OXYCODONE HCL 5 MG PO TABS: 5.0000 mg | ORAL_TABLET | Freq: Three times a day (TID) | ORAL | 0 refills | Status: AC | PRN

## 2024-05-01 MED ORDER — OXYCODONE HCL 5 MG PO TABS
5.0000 mg | ORAL_TABLET | Freq: Once | ORAL | Status: AC
  Administered 2024-05-01: 5 mg via ORAL
  Filled 2024-05-01: qty 1

## 2024-05-01 NOTE — Discharge Instructions (Addendum)
 Your evaluated in the ED for wrist pain.  Your x-ray shows a comminuted distal radial fracture.  Have been placed in a temporary splint you will need to follow-up with orthopedic for further evaluation.  Please call and schedule appointment with Dr. Lorelle with orthopedic tomorrow.  Pain control:  Ibuprofen (motrin/aleve karolyn) - You can take 3 tablets (600 mg) every 6 hours as needed for pain/fever.  Acetaminophen (tylenol) - You can take 2 extra strength tablets (1000 mg) every 6 hours as needed for pain/fever.  You can alternate these medications or take them together.  Make sure you eat food/drink water when taking these medications.

## 2024-05-01 NOTE — ED Notes (Signed)
 Back brought to room from Xray at this time

## 2024-05-01 NOTE — ED Provider Notes (Signed)
 "   [facility] Emergency Department Provider Note     Event Date/Time   First MD Initiated Contact with Patient 05/01/24 1041     (approximate)   History   Wrist Pain   HPI  Bryan Richardson is a 33 y.o. male presents to the ED for evaluation of a wrist injury following a fall yesterday.  Patient reports he was having fun with his family and accidentally fell onto his wrist.  This morning he woke up with moderate swelling and decreased ability to move his fingers.  Sensation remains intact.     Physical Exam   Triage Vital Signs: ED Triage Vitals  Encounter Vitals Group     BP 05/01/24 1025 (!) 136/93     Girls Systolic BP Percentile --      Girls Diastolic BP Percentile --      Boys Systolic BP Percentile --      Boys Diastolic BP Percentile --      Pulse Rate 05/01/24 1025 92     Resp 05/01/24 1025 18     Temp 05/01/24 1025 98.1 F (36.7 C)     Temp src --      SpO2 05/01/24 1025 97 %     Weight 05/01/24 1026 175 lb (79.4 kg)     Height 05/01/24 1026 5' 9 (1.753 m)     Head Circumference --      Peak Flow --      Pain Score 05/01/24 1025 9     Pain Loc --      Pain Education --      Exclude from Growth Chart --     Most recent vital signs: Vitals:   05/01/24 1025 05/01/24 1048  BP: (!) 136/93   Pulse: 92   Resp: 18   Temp: 98.1 F (36.7 C)   SpO2: 97% 99%    General Awake, no distress.  HEENT NCAT.  CV:  Good peripheral perfusion.  RESP:  Normal effort.  ABD:  No distention.  Other:  Right wrist reveals moderate circumferential edema.  There is severe tenderness to palpation over radial aspect of distal forearm.  Limited range of motion secondary to swelling and pain.  Neurovascular status intact all throughout.  Good capillary refill.   ED Results / Procedures / Treatments   Labs (all labs ordered are listed, but only abnormal results are displayed) Labs Reviewed - No data to display  RADIOLOGY  I personally viewed and  evaluated these images as part of my medical decision making, as well as reviewing the written report by the radiologist.  ED Provider Interpretation: Acute fracture to distal radius.  Will confirm with final radiology read.  DG Wrist Complete Right Result Date: 05/01/2024 CLINICAL DATA:  Fall.  Pain and swelling. EXAM: RIGHT WRIST - COMPLETE 3+ VIEW COMPARISON:  None Available. FINDINGS: Comminuted fracture of the distal radius evident with intra-articular extension. No associated distal ulnar fracture. No subluxation or dislocation. IMPRESSION: Comminuted fracture of the distal radius with intra-articular extension. Electronically Signed   By: Camellia Candle M.D.   On: 05/01/2024 11:13    PROCEDURES:  Critical Care performed: No  Procedures   MEDICATIONS ORDERED IN ED: Medications  oxyCODONE  (Oxy IR/ROXICODONE ) immediate release tablet 5 mg (5 mg Oral Given 05/01/24 1145)     IMPRESSION / MDM / ASSESSMENT AND PLAN / ED COURSE  I reviewed the triage vital signs and the nursing notes.  33 y.o. male presents to the emergency department for evaluation and treatment of acute right wrist injury. See HPI for further details.   Differential diagnosis includes, but is not limited to fracture, dislocation, sprain  Patient's presentation is most consistent with acute complicated illness / injury requiring diagnostic workup.  X-ray confirms comminuted fracture of the distal radius with intra-articular extension of the right wrist.  Patient placed in sugar-tong splint.  Oxycodone  provided for pain.  Advised to call and schedule a follow-up appointment with orthopedic for further evaluation tomorrow.  Patient verbalized understanding and is agreement with this care plan.  He is in stable condition for discharge home.  ED return precautions were discussed.  FINAL CLINICAL IMPRESSION(S) / ED DIAGNOSES   Final diagnoses:  Closed fracture of distal end of right  radius, unspecified fracture morphology, initial encounter     Rx / DC Orders   ED Discharge Orders          Ordered    oxyCODONE  (ROXICODONE ) 5 MG immediate release tablet  Every 8 hours PRN        05/01/24 1218             Note:  This document was prepared using Dragon voice recognition software and may include unintentional dictation errors.    Margrette Monte A, PA-C 05/01/24 1232  "

## 2024-05-01 NOTE — ED Triage Notes (Signed)
 Pt to ED for right wrist pain, states walking on brick and tripped.
